# Patient Record
Sex: Male | Born: 1945 | Race: White | Hispanic: No | Marital: Married | Smoking: Never smoker
Health system: Southern US, Community
[De-identification: ages and names within clinical notes are randomized; demographics above are authoritative.]

## PROBLEM LIST (undated history)

## (undated) HISTORY — PX: KNEE SURGERY: SHX244

---

## 2013-04-05 ENCOUNTER — Encounter (HOSPITAL_COMMUNITY): Payer: Self-pay | Admitting: Emergency Medicine

## 2013-04-05 ENCOUNTER — Emergency Department (HOSPITAL_COMMUNITY): Payer: Medicare Other

## 2013-04-05 ENCOUNTER — Emergency Department (HOSPITAL_COMMUNITY)
Admission: EM | Admit: 2013-04-05 | Discharge: 2013-04-05 | Disposition: A | Discharge status: Other Acute Inpatient Hospital | Payer: Medicare Other | Attending: Emergency Medicine | Admitting: Emergency Medicine

## 2013-04-05 DIAGNOSIS — Z23 Encounter for immunization: Secondary | ICD-10-CM | POA: Insufficient documentation

## 2013-04-05 DIAGNOSIS — Y929 Unspecified place or not applicable: Secondary | ICD-10-CM | POA: Insufficient documentation

## 2013-04-05 DIAGNOSIS — IMO0002 Reserved for concepts with insufficient information to code with codable children: Secondary | ICD-10-CM | POA: Insufficient documentation

## 2013-04-05 DIAGNOSIS — W278XXA Contact with other nonpowered hand tool, initial encounter: Secondary | ICD-10-CM | POA: Insufficient documentation

## 2013-04-05 DIAGNOSIS — Y9389 Activity, other specified: Secondary | ICD-10-CM | POA: Insufficient documentation

## 2013-04-05 LAB — BASIC METABOLIC PANEL
BUN: 14 mg/dL (ref 6–23)
CO2: 23 meq/L (ref 19–32)
Calcium: 9.5 mg/dL (ref 8.4–10.5)
Chloride: 99 mEq/L (ref 96–112)
Creatinine, Ser: 0.78 mg/dL (ref 0.50–1.35)
GFR calc Af Amer: >90 mL/min (ref >90)
Glucose, Bld: 127 mg/dL — ABNORMAL HIGH (ref 70–99)
Potassium: 3.5 mEq/L — ABNORMAL LOW (ref 3.7–5.3)
SODIUM: 139 meq/L (ref 137–147)

## 2013-04-05 LAB — CBC WITH DIFFERENTIAL/PLATELET
BASOS ABS: 0.1 10*3/uL (ref 0.0–0.1)
Basophils Relative: 1 % (ref 0–1)
Eosinophils Absolute: 0.1 10*3/uL (ref 0.0–0.7)
Eosinophils Relative: 1 % (ref 0–5)
HCT: 43.2 % (ref 39.0–52.0)
Hemoglobin: 15.1 g/dL (ref 13.0–17.0)
LYMPHS PCT: 53 % — AB (ref 12–46)
Lymphs Abs: 4.9 10*3/uL — ABNORMAL HIGH (ref 0.7–4.0)
MCH: 31 pg (ref 26.0–34.0)
MCHC: 35 g/dL (ref 30.0–36.0)
MCV: 88.7 fL (ref 78.0–100.0)
Monocytes Absolute: 1.1 10*3/uL — ABNORMAL HIGH (ref 0.1–1.0)
Monocytes Relative: 12 % (ref 3–12)
NEUTROS PCT: 33 % — AB (ref 43–77)
Neutro Abs: 3 10*3/uL (ref 1.7–7.7)
PLATELETS: 266 10*3/uL (ref 150–400)
RBC: 4.87 MIL/uL (ref 4.22–5.81)
RDW: 12.5 % (ref 11.5–15.5)
WBC: 9.2 10*3/uL (ref 4.0–10.5)

## 2013-04-05 MED ORDER — CEFAZOLIN SODIUM-DEXTROSE 2-3 GM-% IV SOLR
2.0000 g | Freq: Once | INTRAVENOUS | Status: AC
  Administered 2013-04-05: 2 g via INTRAVENOUS
  Filled 2013-04-05: qty 50

## 2013-04-05 MED ORDER — TETANUS-DIPHTH-ACELL PERTUSSIS 5-2.5-18.5 LF-MCG/0.5 IM SUSP
0.5000 mL | Freq: Once | INTRAMUSCULAR | Status: AC
  Administered 2013-04-05: 0.5 mL via INTRAMUSCULAR
  Filled 2013-04-05: qty 0.5

## 2013-04-05 NOTE — ED Notes (Signed)
Pt reports while working on a table saw. Pt sustained amputation of left middle finger, pointer finger is still intact. Pt has no feeling to last 3 fingers. Pt unsure of what really happened with use of table saw.

## 2013-04-05 NOTE — Consult Note (Signed)
Suszanne Finch Jump Brooke Bonito. is an 68 y.o. male.   Chief Complaint: left hand table saw injury HPI: 68 yo rhd male states he was using a table saw ~ 8 AM to build a table and suffered laceration to the left hand.  Unsure exactly how the injury occurred.  Brought to [facility] where he was evaluated and I was consulted for management of the injury.  He reports no previous injury to left hand and no other injury at this time.  History reviewed. No pertinent past medical history.  Past Surgical History  Procedure Laterality Date  . Knee surgery Left     No family history on file. Social History:  reports that he has never smoked. He does not have any smokeless tobacco history on file. He reports that he does not drink alcohol or use illicit drugs.  Allergies: No Known Allergies   (Not in a hospital admission)  Results for orders placed during the hospital encounter of 04/05/13 (from the past 48 hour(s))  CBC WITH DIFFERENTIAL     Status: Abnormal   Collection Time    04/05/13  8:49 AM      Result Value Ref Range   WBC 9.2  4.0 - 10.5 K/uL   RBC 4.87  4.22 - 5.81 MIL/uL   Hemoglobin 15.1  13.0 - 17.0 g/dL   HCT 43.2  39.0 - 52.0 %   MCV 88.7  78.0 - 100.0 fL   MCH 31.0  26.0 - 34.0 pg   MCHC 35.0  30.0 - 36.0 g/dL   RDW 12.5  11.5 - 15.5 %   Platelets 266  150 - 400 K/uL   Neutrophils Relative % 33 (*) 43 - 77 %   Neutro Abs 3.0  1.7 - 7.7 K/uL   Lymphocytes Relative 53 (*) 12 - 46 %   Lymphs Abs 4.9 (*) 0.7 - 4.0 K/uL   Monocytes Relative 12  3 - 12 %   Monocytes Absolute 1.1 (*) 0.1 - 1.0 K/uL   Eosinophils Relative 1  0 - 5 %   Eosinophils Absolute 0.1  0.0 - 0.7 K/uL   Basophils Relative 1  0 - 1 %   Basophils Absolute 0.1  0.0 - 0.1 K/uL  BASIC METABOLIC PANEL     Status: Abnormal   Collection Time    04/05/13  8:49 AM      Result Value Ref Range   Sodium 139  137 - 147 mEq/L   Potassium 3.5 (*) 3.7 - 5.3 mEq/L   Chloride 99  96 - 112 mEq/L   CO2 23  19 - 32 mEq/L   Glucose, Bld 127 (*) 70 - 99 mg/dL   BUN 14  6 - 23 mg/dL   Creatinine, Ser 0.78  0.50 - 1.35 mg/dL   Calcium 9.5  8.4 - 10.5 mg/dL   GFR calc non Af Amer >90  >90 mL/min   GFR calc Af Amer >90  >90 mL/min   Comment: (NOTE)     The eGFR has been calculated using the CKD EPI equation.     This calculation has not been validated in all clinical situations.     eGFR's persistently <90 mL/min signify possible Chronic Kidney     Disease.    Dg Hand Complete Left  04/05/2013   CLINICAL DATA:  Table saw injury.  EXAM: LEFT HAND - COMPLETE 3+ VIEW  COMPARISON:  None.  FINDINGS: There has been amputation of the left index finger at  the level of the distal portion of the first phalanx. Partial amputations of the third, fourth and fifth fingers are noted with comminuted fractures involving the third and fourth proximal phalanges. Severely displaced fracture is seen involving proximal portion of the fifth proximal phalanx.  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   Electronically Signed   By: Sabino Dick M.D.   On: 04/05/2013 09:13     A comprehensive review of systems was negative.  Blood pressure 165/90, pulse 69, temperature 98 F (36.7 C), temperature source Oral, resp. rate 15, height 5' 10.5" (1.791 m), weight 77.111 kg (170 lb), SpO2 98.00%.  General appearance: alert, cooperative and appears stated age Head: Normocephalic, without obvious abnormality, atraumatic Neck: supple, symmetrical, trachea midline Extremities: right ue: intact sensation and capillary refill all digits.  +epl/fpl/io.  no wounds or ttp.  left ue: thumb: intact sensation and capillary refill, no wounds; index: some sensation ulnar side, intact capillary refill, radial tissues lacerated with bleeding vessel noted, ulnar side intact; long: no sensation or capillary refill, only dorsal skin and extensor tendon intact; ring: decreased sensation,  intact capillary refill in pad, active extension, no active flexion, volar wound; small: no sensation or capillary refill, only dorsal skin remains. Pulses: 2+ and symmetric Skin: as above Neurologic: Grossly normal except as noted above Incision/Wound: As above  Assessment/Plan Table saw injury to all four fingers with neurovascular injury.  Long and small severely compromised.  Discussed nature of injury with patient.  Recommend transfer to [facility] for evaluation for potential repair of digits vs revision of amputation given that all four fingers involved.  Transfer arranged with Dr. Morley Kos through ED at [facility].  Patient agrees with plan of care.  Pressure dressing applied to index finger to control bleeding.  Wounds dressed and volar splint applied.  Keep hand elevated.  Neng Albee R 04/05/2013, 11:31 AM

## 2013-04-05 NOTE — ED Notes (Signed)
Pt dressed in gown 

## 2013-04-05 NOTE — ED Notes (Signed)
Patient transferred to [facility].

## 2013-04-05 NOTE — Progress Notes (Signed)
Orthopedic Tech Progress Note Patient Details:  Roberto PufferLeo Grant Scaglione Jr. [birth year] [id number]  Ortho Devices Type of Ortho Device: Volar splint Ortho Device/Splint Interventions: Application   Cammer, Mickie BailJennifer Carol 04/05/2013, 11:30 AM

## 2013-04-05 NOTE — ED Provider Notes (Signed)
CSN: [id number]     Arrival date & time 04/05/13  [id number] History   First MD Initiated Contact with Patient 04/05/13 [id number]     Chief Complaint  Patient presents with  . Finger Injury     (Consider location/radiation/quality/duration/timing/severity/associated sxs/prior Treatment) Patient is a 68 y.o. male presenting with hand injury.  Hand Injury Location:  Hand Time since incident:  30 minutes Injury: yes   Mechanism of injury: amputation   Amputation:    Extent:  Partial   Cause: Table saw. Hand location:  L hand Pain details:    Quality:  Sharp   Radiates to:  Does not radiate   Pain severity now: 2/10.   Onset quality:  Sudden   Timing:  Constant   Progression:  Unchanged Chronicity:  New Tetanus status:  Out of date Relieved by:  Nothing Worsened by:  Movement Associated symptoms: numbness   Associated symptoms: no neck pain     History reviewed. No pertinent past medical history. Past Surgical History  Procedure Laterality Date  . Knee surgery Left    No family history on file. History  Substance Use Topics  . Smoking status: Never Smoker   . Smokeless tobacco: Not on file  . Alcohol Use: No    Review of Systems  Musculoskeletal: Negative for neck pain.  All other systems reviewed and are negative.      Allergies  Review of patient's allergies indicates no known allergies.  Home Medications  No current outpatient prescriptions on file. BP 167/80  Pulse 78  Temp(Src) 98 F (36.7 C) (Oral)  Resp 17  Ht 5' 10.5" (1.791 m)  Wt 170 lb (77.111 kg)  BMI 24.04 kg/m2  SpO2 98% Physical Exam  Nursing note and vitals reviewed. Constitutional: He is oriented to person, place, and time. He appears well-developed and well-nourished. No distress.  HENT:  Head: Normocephalic and atraumatic.  Eyes: Conjunctivae are normal. No scleral icterus.  Neck: Neck supple.  Cardiovascular: Normal rate and intact distal pulses.   Pulmonary/Chest: Effort normal. No  stridor. No respiratory distress.  Abdominal: Normal appearance. He exhibits no distension.  Musculoskeletal:  Extensive lacerations through the dorsum of his 4 fingers of his left hand. Thumb is spared. Lacerations are just distal to his MCP joints. He has decreased sensation in fingers 3 through 5. Decreased perfusion in all fingers.  Neurological: He is alert and oriented to person, place, and time.  Skin: Skin is warm and dry. No rash noted.  Psychiatric: He has a normal mood and affect. His behavior is normal.    ED Course  Procedures (including critical care time) Labs Review Labs Reviewed  CBC WITH DIFFERENTIAL - Abnormal; Notable for the following:    Neutrophils Relative % 33 (*)    Lymphocytes Relative 53 (*)    Lymphs Abs 4.9 (*)    Monocytes Absolute 1.1 (*)    All other components within normal limits  BASIC METABOLIC PANEL - Abnormal; Notable for the following:    Potassium 3.5 (*)    Glucose, Bld 127 (*)    All other components within normal limits   Imaging Review Dg Hand Complete Left  04/05/2013   CLINICAL DATA:  Table saw injury.  EXAM: LEFT HAND - COMPLETE 3+ VIEW  COMPARISON:  None.  FINDINGS: There has been amputation of the left index finger at the level of the distal portion of the first phalanx. Partial amputations of the third, fourth and fifth fingers are noted with comminuted  fractures involving the third and fourth proximal phalanges. Severely displaced fracture is seen involving proximal portion of the fifth proximal phalanx.  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   Electronically Signed   By: Roque Lias M.D.   On: 04/05/2013 09:13  All radiology studies independently viewed by me.      EKG Interpretation   Date/Time:  Saturday April 05 2013 09:03:03 EDT Ventricular Rate:  80 PR Interval:  183 QRS Duration: 83 QT Interval:  389 QTC Calculation: 449 R Axis:    70 Text Interpretation:  Sinus rhythm LVH with secondary repolarization  abnormality Anterior Q waves, possibly due to LVH No old tracing to  compare Confirmed by [facility]  MD, TREY ([id number]) on 04/05/2013 10:16:47 AM      MDM   Final diagnoses:  Traumatic amputation of other finger(s) (complete) (partial), without mention of complication    68 year old male presenting with partial amputation of left second through fifth fingers. I consulted Dr. Merlyn Lot (hand surgery) within 20 minutes of patient's initial arrival. Also given tetanus update and IV Ancef.  11:12 AM Pt seen and evaluated by Dr. Merlyn Lot.  He has discussed with Dr. Thompson Caul (Plastic Surgery at [facility]) who will accept in transfer for repair of his hand.  At this time, pt has declined pain medication.    Meds ordered this encounter  Medications  . Tdap (BOOSTRIX) injection 0.5 mL    Sig:   . ceFAZolin (ANCEF) IVPB 2 g/50 mL premix    Sig:     Order Specific Question:  Antibiotic Indication:    Answer:  Other Indication (list below)     Candyce Churn III, MD 04/05/13 [phone]

## 2015-08-11 IMAGING — CR DG HAND COMPLETE 3+V*L*
3 series · 3 of 3 positions shown · non-contrast
Comparison: None.

CLINICAL DATA: Table saw injury.

EXAM:
LEFT HAND - COMPLETE 3+ VIEW

[PA]
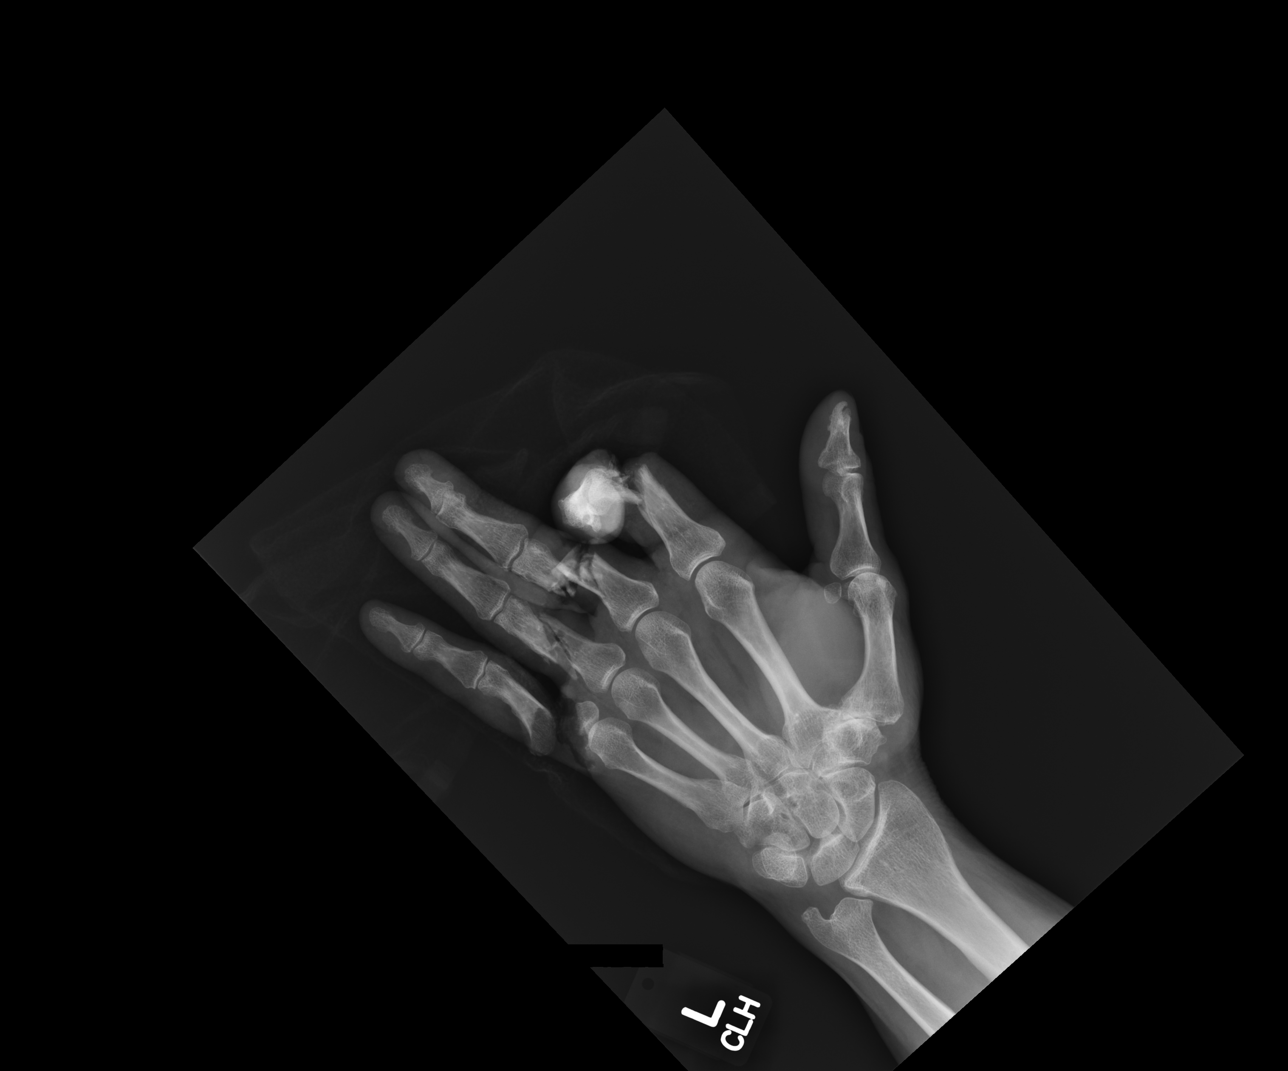

[lateral]
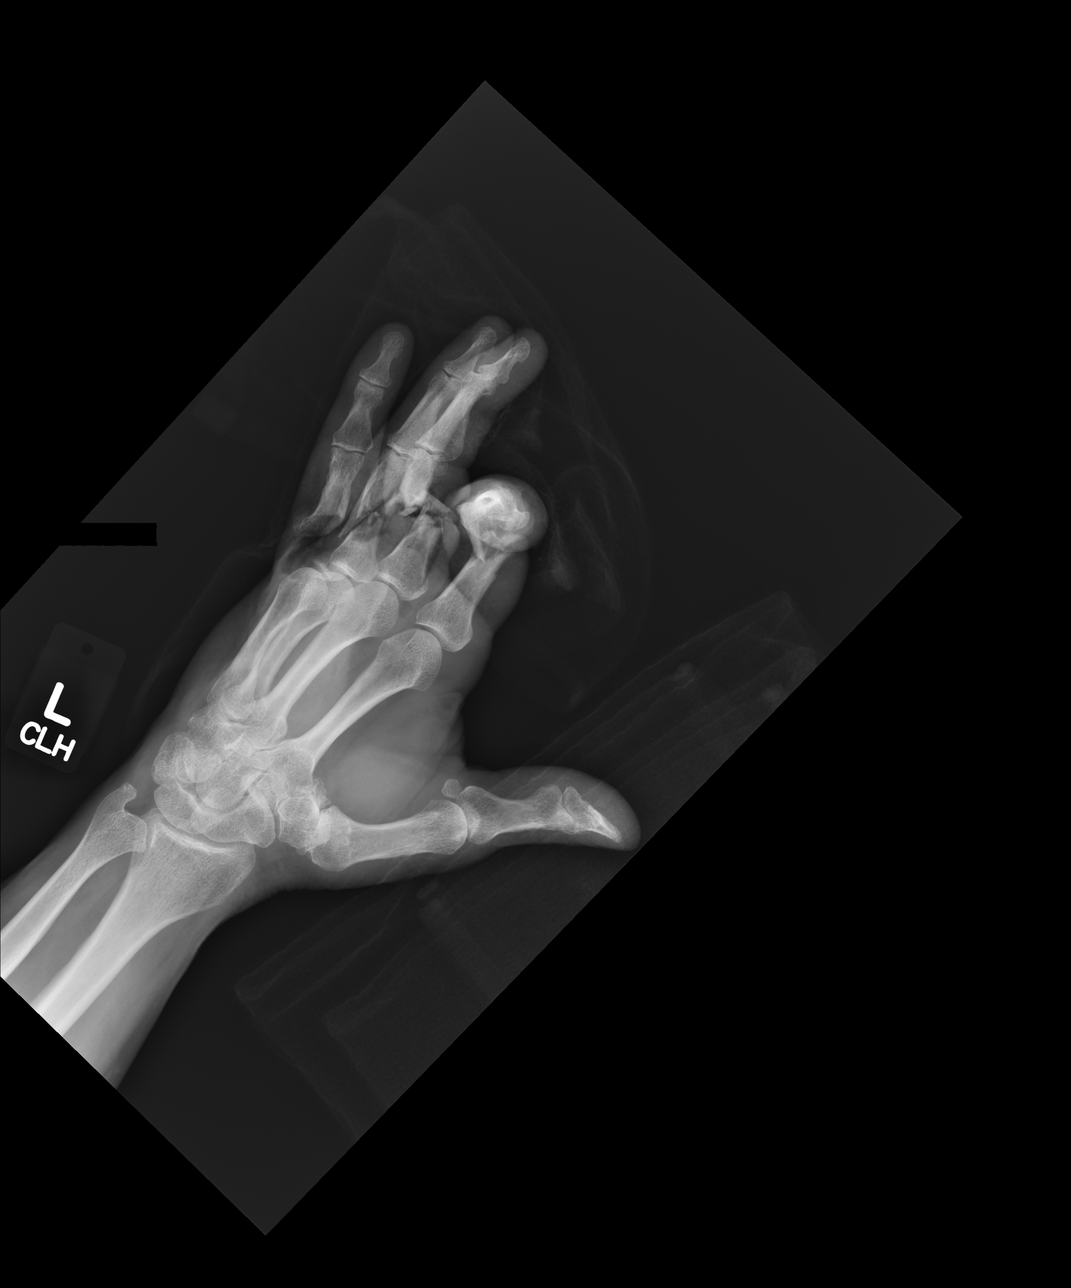

[pa obl]
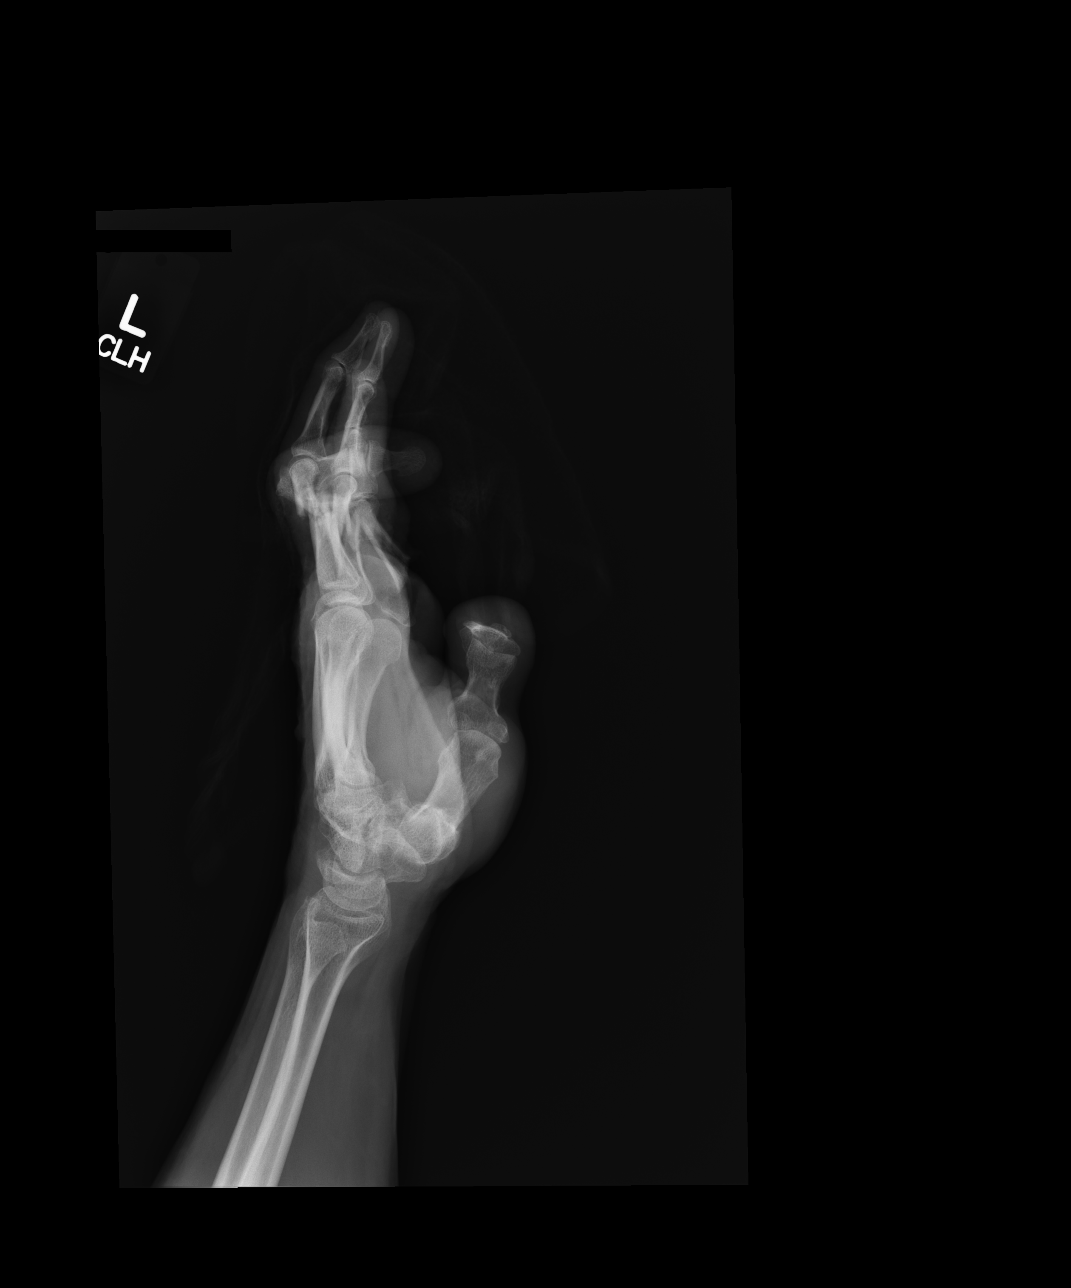

[3 of 3 positions shown; findings below may reference images not displayed]

FINDINGS: There has been amputation of the left index finger at the level of
the distal portion of the first phalanx. Partial amputations of the
third, fourth and fifth fingers are noted with comminuted fractures
involving the third and fourth proximal phalanges. Severely
displaced fracture is seen involving proximal portion of the fifth
proximal phalanx.
IMPRESSION: Traumatic amputation of the left index finger with partial
amputations of the third, fourth and fifth fingers.

## 2022-11-18 ENCOUNTER — Emergency Department (HOSPITAL_COMMUNITY): Payer: Medicare Other

## 2022-11-18 ENCOUNTER — Other Ambulatory Visit: Payer: Self-pay

## 2022-11-18 ENCOUNTER — Encounter (HOSPITAL_COMMUNITY): Payer: Self-pay

## 2022-11-18 ENCOUNTER — Inpatient Hospital Stay (HOSPITAL_COMMUNITY): Payer: Medicare Other

## 2022-11-18 ENCOUNTER — Encounter (HOSPITAL_COMMUNITY)
Admission: EM | Disposition: A | Discharge status: Home / Self Care | Payer: Self-pay | Source: Home / Self Care | Attending: Neurological Surgery

## 2022-11-18 ENCOUNTER — Inpatient Hospital Stay (HOSPITAL_COMMUNITY)
Admission: EM | Admit: 2022-11-18 | Discharge: 2022-11-20 | DRG: 030 | Disposition: A | Discharge status: Home / Self Care | Payer: Medicare Other | Attending: Neurological Surgery | Admitting: Neurological Surgery

## 2022-11-18 DIAGNOSIS — S90512A Abrasion, left ankle, initial encounter: Secondary | ICD-10-CM | POA: Diagnosis present

## 2022-11-18 DIAGNOSIS — S13161A Dislocation of C5/C6 cervical vertebrae, initial encounter: Secondary | ICD-10-CM | POA: Diagnosis present

## 2022-11-18 DIAGNOSIS — S40212A Abrasion of left shoulder, initial encounter: Secondary | ICD-10-CM | POA: Diagnosis present

## 2022-11-18 DIAGNOSIS — W12XXXA Fall on and from scaffolding, initial encounter: Secondary | ICD-10-CM | POA: Diagnosis present

## 2022-11-18 DIAGNOSIS — S12530A Unspecified traumatic displaced spondylolisthesis of sixth cervical vertebra, initial encounter for closed fracture: Secondary | ICD-10-CM | POA: Diagnosis present

## 2022-11-18 DIAGNOSIS — Y9389 Activity, other specified: Secondary | ICD-10-CM

## 2022-11-18 DIAGNOSIS — M532X2 Spinal instabilities, cervical region: Secondary | ICD-10-CM | POA: Diagnosis present

## 2022-11-18 DIAGNOSIS — D72829 Elevated white blood cell count, unspecified: Secondary | ICD-10-CM | POA: Diagnosis present

## 2022-11-18 DIAGNOSIS — S12500A Unspecified displaced fracture of sixth cervical vertebra, initial encounter for closed fracture: Secondary | ICD-10-CM | POA: Diagnosis present

## 2022-11-18 DIAGNOSIS — S12400A Unspecified displaced fracture of fifth cervical vertebra, initial encounter for closed fracture: Secondary | ICD-10-CM

## 2022-11-18 DIAGNOSIS — R531 Weakness: Secondary | ICD-10-CM | POA: Diagnosis present

## 2022-11-18 DIAGNOSIS — S14155A Other incomplete lesion at C5 level of cervical spinal cord, initial encounter: Principal | ICD-10-CM | POA: Diagnosis present

## 2022-11-18 DIAGNOSIS — Y92018 Other place in single-family (private) house as the place of occurrence of the external cause: Secondary | ICD-10-CM | POA: Diagnosis not present

## 2022-11-18 DIAGNOSIS — Z9889 Other specified postprocedural states: Secondary | ICD-10-CM

## 2022-11-18 DIAGNOSIS — M542 Cervicalgia: Secondary | ICD-10-CM | POA: Diagnosis present

## 2022-11-18 DIAGNOSIS — S129XXA Fracture of neck, unspecified, initial encounter: Secondary | ICD-10-CM | POA: Diagnosis present

## 2022-11-18 DIAGNOSIS — W19XXXA Unspecified fall, initial encounter: Principal | ICD-10-CM

## 2022-11-18 DIAGNOSIS — M4312 Spondylolisthesis, cervical region: Secondary | ICD-10-CM

## 2022-11-18 HISTORY — PX: ANTERIOR CERVICAL DECOMP/DISCECTOMY FUSION: SHX1161

## 2022-11-18 LAB — SAMPLE TO BLOOD BANK

## 2022-11-18 LAB — COMPREHENSIVE METABOLIC PANEL
ALT: 24 U/L (ref 0–44)
AST: 60 U/L — ABNORMAL HIGH (ref 15–41)
Albumin: 3.6 g/dL (ref 3.5–5.0)
Alkaline Phosphatase: 67 U/L (ref 38–126)
Anion gap: 8 (ref 5–15)
BUN: 20 mg/dL (ref 8–23)
CO2: 23 mmol/L (ref 22–32)
Calcium: 8.6 mg/dL — ABNORMAL LOW (ref 8.9–10.3)
Chloride: 105 mmol/L (ref 98–111)
Creatinine, Ser: 0.92 mg/dL (ref 0.61–1.24)
GFR, Estimated: >60 mL/min (ref >60)
Glucose, Bld: 125 mg/dL — ABNORMAL HIGH (ref 70–99)
Potassium: 4.1 mmol/L (ref 3.5–5.1)
Sodium: 136 mmol/L (ref 135–145)
Total Bilirubin: 1.1 mg/dL (ref 0.3–1.2)
Total Protein: 6 g/dL — ABNORMAL LOW (ref 6.5–8.1)

## 2022-11-18 LAB — CBC
HCT: 38.1 % — ABNORMAL LOW (ref 39.0–52.0)
Hemoglobin: 12.4 g/dL — ABNORMAL LOW (ref 13.0–17.0)
MCH: 28.8 pg (ref 26.0–34.0)
MCHC: 32.5 g/dL (ref 30.0–36.0)
MCV: 88.4 fL (ref 80.0–100.0)
Platelets: 206 10*3/uL (ref 150–400)
RBC: 4.31 MIL/uL (ref 4.22–5.81)
RDW: 12.8 % (ref 11.5–15.5)
WBC: 11.9 10*3/uL — ABNORMAL HIGH (ref 4.0–10.5)
nRBC: 0 % (ref 0.0–0.2)

## 2022-11-18 LAB — PROTIME-INR
INR: 1 (ref 0.8–1.2)
Prothrombin Time: 13.4 s (ref 11.4–15.2)

## 2022-11-18 LAB — ETHANOL: Alcohol, Ethyl (B): <10 mg/dL (ref <10)

## 2022-11-18 SURGERY — ANTERIOR CERVICAL DECOMPRESSION/DISCECTOMY FUSION 1 LEVEL
Anesthesia: General | Site: Neck

## 2022-11-18 MED ORDER — DEXAMETHASONE SODIUM PHOSPHATE 10 MG/ML IJ SOLN
INTRAMUSCULAR | Status: AC
  Filled 2022-11-18: qty 1

## 2022-11-18 MED ORDER — OXYCODONE HCL 5 MG PO TABS: 10.0000 mg | ORAL_TABLET | ORAL | Status: DC | PRN

## 2022-11-18 MED ORDER — CYCLOBENZAPRINE HCL 10 MG PO TABS: 10.0000 mg | ORAL_TABLET | Freq: Three times a day (TID) | ORAL | Status: DC | PRN

## 2022-11-18 MED ORDER — ONDANSETRON HCL 4 MG PO TABS
4.0000 mg | ORAL_TABLET | Freq: Four times a day (QID) | ORAL | Status: DC | PRN
Start: 2022-11-18 — End: 2022-11-20

## 2022-11-18 MED ORDER — BACITRACIN ZINC 500 UNIT/GM EX OINT
TOPICAL_OINTMENT | CUTANEOUS | Status: AC
Start: 2022-11-18 — End: ?
  Filled 2022-11-18: qty 28.35

## 2022-11-18 MED ORDER — SODIUM CHLORIDE 0.9% FLUSH
3.0000 mL | Freq: Two times a day (BID) | INTRAVENOUS | Status: DC
  Administered 2022-11-19 (×2): 3 mL via INTRAVENOUS

## 2022-11-18 MED ORDER — CEFAZOLIN SODIUM-DEXTROSE 2-3 GM-%(50ML) IV SOLR
INTRAVENOUS | Status: DC | PRN
  Administered 2022-11-18: 2 g via INTRAVENOUS

## 2022-11-18 MED ORDER — CEFAZOLIN SODIUM-DEXTROSE 2-4 GM/100ML-% IV SOLN
2.0000 g | Freq: Three times a day (TID) | INTRAVENOUS | Status: AC
  Administered 2022-11-19 (×2): 2 g via INTRAVENOUS
  Filled 2022-11-18 (×2): qty 100

## 2022-11-18 MED ORDER — LIDOCAINE 2% (20 MG/ML) 5 ML SYRINGE
INTRAMUSCULAR | Status: AC
  Filled 2022-11-18: qty 5

## 2022-11-18 MED ORDER — DEXAMETHASONE SODIUM PHOSPHATE 10 MG/ML IJ SOLN
INTRAMUSCULAR | Status: DC | PRN
  Administered 2022-11-18: 10 mg via INTRAVENOUS

## 2022-11-18 MED ORDER — ROCURONIUM BROMIDE 10 MG/ML (PF) SYRINGE
PREFILLED_SYRINGE | INTRAVENOUS | Status: DC | PRN
  Administered 2022-11-18: 70 mg via INTRAVENOUS

## 2022-11-18 MED ORDER — TETANUS-DIPHTH-ACELL PERTUSSIS 5-2.5-18.5 LF-MCG/0.5 IM SUSY
0.5000 mL | PREFILLED_SYRINGE | Freq: Once | INTRAMUSCULAR | Status: DC
  Filled 2022-11-18: qty 0.5

## 2022-11-18 MED ORDER — DROPERIDOL 2.5 MG/ML IJ SOLN: 0.6250 mg | Freq: Once | INTRAMUSCULAR | Status: DC | PRN

## 2022-11-18 MED ORDER — SODIUM CHLORIDE 0.9 % IV SOLN
250.0000 mL | INTRAVENOUS | Status: AC
  Administered 2022-11-18: 250 mL via INTRAVENOUS

## 2022-11-18 MED ORDER — THROMBIN 5000 UNITS EX SOLR
CUTANEOUS | Status: AC
Start: 2022-11-18 — End: ?
  Filled 2022-11-18: qty 5000

## 2022-11-18 MED ORDER — ACETAMINOPHEN 325 MG PO TABS
650.0000 mg | ORAL_TABLET | ORAL | Status: DC | PRN
  Filled 2022-11-18: qty 2

## 2022-11-18 MED ORDER — EPHEDRINE SULFATE-NACL 50-0.9 MG/10ML-% IV SOSY
PREFILLED_SYRINGE | INTRAVENOUS | Status: DC | PRN
  Administered 2022-11-18: 5 mg via INTRAVENOUS

## 2022-11-18 MED ORDER — POLYETHYLENE GLYCOL 3350 17 G PO PACK: 17.0000 g | PACK | Freq: Every day | ORAL | Status: DC | PRN

## 2022-11-18 MED ORDER — HYDROMORPHONE HCL 1 MG/ML IJ SOLN: 1.0000 mg | INTRAMUSCULAR | Status: DC | PRN

## 2022-11-18 MED ORDER — ROCURONIUM BROMIDE 10 MG/ML (PF) SYRINGE
PREFILLED_SYRINGE | INTRAVENOUS | Status: AC
  Filled 2022-11-18: qty 10

## 2022-11-18 MED ORDER — THROMBIN 5000 UNITS EX SOLR: OROMUCOSAL | Status: DC | PRN

## 2022-11-18 MED ORDER — FENTANYL CITRATE (PF) 250 MCG/5ML IJ SOLN
INTRAMUSCULAR | Status: DC | PRN
  Administered 2022-11-18: 150 ug via INTRAVENOUS

## 2022-11-18 MED ORDER — LORAZEPAM 1 MG PO TABS: 1.0000 mg | ORAL_TABLET | Freq: Once | ORAL | Status: DC

## 2022-11-18 MED ORDER — 0.9 % SODIUM CHLORIDE (POUR BTL) OPTIME
TOPICAL | Status: DC | PRN
  Administered 2022-11-18: 1000 mL

## 2022-11-18 MED ORDER — OXYCODONE HCL 5 MG/5ML PO SOLN: 5.0000 mg | Freq: Once | ORAL | Status: DC | PRN

## 2022-11-18 MED ORDER — PROPOFOL 10 MG/ML IV BOLUS
INTRAVENOUS | Status: DC | PRN
  Administered 2022-11-18: 150 mg via INTRAVENOUS

## 2022-11-18 MED ORDER — FENTANYL CITRATE (PF) 250 MCG/5ML IJ SOLN
INTRAMUSCULAR | Status: AC
  Filled 2022-11-18: qty 5

## 2022-11-18 MED ORDER — SODIUM CHLORIDE 0.9% FLUSH
3.0000 mL | INTRAVENOUS | Status: DC | PRN
Start: 2022-11-18 — End: 2022-11-20

## 2022-11-18 MED ORDER — OXYCODONE HCL 5 MG PO TABS: 5.0000 mg | ORAL_TABLET | Freq: Once | ORAL | Status: DC | PRN

## 2022-11-18 MED ORDER — CEFAZOLIN SODIUM 1 G IJ SOLR
INTRAMUSCULAR | Status: AC
Start: 2022-11-18 — End: ?
  Filled 2022-11-18: qty 20

## 2022-11-18 MED ORDER — ONDANSETRON HCL 4 MG/2ML IJ SOLN
INTRAMUSCULAR | Status: DC | PRN
  Administered 2022-11-18: 4 mg via INTRAVENOUS

## 2022-11-18 MED ORDER — FENTANYL CITRATE (PF) 100 MCG/2ML IJ SOLN: 25.0000 ug | INTRAMUSCULAR | Status: DC | PRN

## 2022-11-18 MED ORDER — EPHEDRINE 5 MG/ML INJ
INTRAVENOUS | Status: AC
  Filled 2022-11-18: qty 5

## 2022-11-18 MED ORDER — LACTATED RINGERS IV SOLN: INTRAVENOUS | Status: DC | PRN

## 2022-11-18 MED ORDER — ACETAMINOPHEN 650 MG RE SUPP: 650.0000 mg | RECTAL | Status: DC | PRN

## 2022-11-18 MED ORDER — ONDANSETRON HCL 4 MG/2ML IJ SOLN
4.0000 mg | Freq: Four times a day (QID) | INTRAMUSCULAR | Status: DC | PRN
Start: 2022-11-18 — End: 2022-11-20

## 2022-11-18 MED ORDER — LIDOCAINE-EPINEPHRINE 1 %-1:100000 IJ SOLN
INTRAMUSCULAR | Status: AC
  Filled 2022-11-18: qty 1

## 2022-11-18 MED ORDER — DOCUSATE SODIUM 100 MG PO CAPS
100.0000 mg | ORAL_CAPSULE | Freq: Two times a day (BID) | ORAL | Status: DC
  Administered 2022-11-19 – 2022-11-20 (×4): 100 mg via ORAL
  Filled 2022-11-18 (×4): qty 1

## 2022-11-18 MED ORDER — ONDANSETRON HCL 4 MG/2ML IJ SOLN
INTRAMUSCULAR | Status: AC
Start: 2022-11-18 — End: ?
  Filled 2022-11-18: qty 2

## 2022-11-18 MED ORDER — PROPOFOL 10 MG/ML IV BOLUS
INTRAVENOUS | Status: AC
  Filled 2022-11-18: qty 20

## 2022-11-18 MED ORDER — LIDOCAINE 2% (20 MG/ML) 5 ML SYRINGE
INTRAMUSCULAR | Status: DC | PRN
  Administered 2022-11-18: 80 mg via INTRAVENOUS

## 2022-11-18 MED ORDER — LIDOCAINE-EPINEPHRINE 1 %-1:100000 IJ SOLN
INTRAMUSCULAR | Status: DC | PRN
  Administered 2022-11-18: 10 mL via INTRADERMAL

## 2022-11-18 MED ORDER — MENTHOL 3 MG MT LOZG: 1.0000 | LOZENGE | OROMUCOSAL | Status: DC | PRN

## 2022-11-18 MED ORDER — PHENOL 1.4 % MT LIQD: 1.0000 | OROMUCOSAL | Status: DC | PRN

## 2022-11-18 MED ORDER — OXYCODONE HCL 5 MG PO TABS: 5.0000 mg | ORAL_TABLET | ORAL | Status: DC | PRN

## 2022-11-18 MED ORDER — ACETAMINOPHEN 10 MG/ML IV SOLN: 1000.0000 mg | Freq: Once | INTRAVENOUS | Status: DC | PRN

## 2022-11-18 MED ORDER — SUGAMMADEX SODIUM 200 MG/2ML IV SOLN
INTRAVENOUS | Status: DC | PRN
  Administered 2022-11-18 (×2): 100 mg via INTRAVENOUS

## 2022-11-18 SURGICAL SUPPLY — 68 items
ADH SKN CLS APL DERMABOND .7 (GAUZE/BANDAGES/DRESSINGS) ×4
ADH SKN CLS LQ APL DERMABOND (GAUZE/BANDAGES/DRESSINGS) ×2
APL SKNCLS STERI-STRIP NONHPOA (GAUZE/BANDAGES/DRESSINGS)
BAG COUNTER SPONGE SURGICOUNT (BAG) ×4 IMPLANT
BAG SPNG CNTER NS LX DISP (BAG) ×4
BENZOIN TINCTURE PRP APPL 2/3 (GAUZE/BANDAGES/DRESSINGS) IMPLANT
BLADE CLIPPER SURG (BLADE) ×2 IMPLANT
BLADE SURG 11 STRL SS (BLADE) ×2 IMPLANT
BUR MATCHSTICK NEURO 3.0 LAGG (BURR) ×2 IMPLANT
BUR PRECISION FLUTE 5.0 (BURR) ×2 IMPLANT
CANISTER SUCT 3000ML PPV (MISCELLANEOUS) ×4 IMPLANT
DERMABOND ADVANCED .7 DNX12 (GAUZE/BANDAGES/DRESSINGS) ×4 IMPLANT
DERMABOND ADVANCED .7 DNX6 (GAUZE/BANDAGES/DRESSINGS) ×1 IMPLANT
DRAPE C-ARM 42X72 X-RAY (DRAPES) ×8 IMPLANT
DRAPE HALF SHEET 40X57 (DRAPES) IMPLANT
DRAPE LAPAROTOMY 100X72 PEDS (DRAPES) ×4 IMPLANT
DRAPE MICROSCOPE SLANT 54X150 (MISCELLANEOUS) ×2 IMPLANT
DURAPREP 6ML APPLICATOR 50/CS (WOUND CARE) ×4 IMPLANT
ELECT COATED BLADE 2.86 ST (ELECTRODE) ×2 IMPLANT
ELECT REM PT RETURN 9FT ADLT (ELECTROSURGICAL) ×4
ELECTRODE REM PT RTRN 9FT ADLT (ELECTROSURGICAL) ×4 IMPLANT
GAUZE 4X4 16PLY ~~LOC~~+RFID DBL (SPONGE) IMPLANT
GAUZE SPONGE 4X4 12PLY STRL (GAUZE/BANDAGES/DRESSINGS) IMPLANT
GLOVE BIO SURGEON STRL SZ7 (GLOVE) ×1 IMPLANT
GLOVE BIO SURGEON STRL SZ7.5 (GLOVE) ×4 IMPLANT
GLOVE BIOGEL PI IND STRL 7.5 (GLOVE) ×9 IMPLANT
GLOVE ECLIPSE 7.5 STRL STRAW (GLOVE) ×4 IMPLANT
GLOVE EXAM NITRILE LRG STRL (GLOVE) IMPLANT
GLOVE EXAM NITRILE XL STR (GLOVE) IMPLANT
GLOVE EXAM NITRILE XS STR PU (GLOVE) IMPLANT
GOWN STRL REUS W/ TWL LRG LVL3 (GOWN DISPOSABLE) ×4 IMPLANT
GOWN STRL REUS W/ TWL XL LVL3 (GOWN DISPOSABLE) ×2 IMPLANT
GOWN STRL REUS W/TWL 2XL LVL3 (GOWN DISPOSABLE) IMPLANT
GOWN STRL REUS W/TWL LRG LVL3 (GOWN DISPOSABLE) ×4
GOWN STRL REUS W/TWL XL LVL3 (GOWN DISPOSABLE) ×2
HEMOSTAT POWDER KIT SURGIFOAM (HEMOSTASIS) ×4 IMPLANT
KIT BASIN OR (CUSTOM PROCEDURE TRAY) ×4 IMPLANT
KIT TURNOVER KIT B (KITS) ×4 IMPLANT
NDL HYPO 22X1.5 SAFETY MO (MISCELLANEOUS) ×2 IMPLANT
NDL SPNL 18GX3.5 QUINCKE PK (NEEDLE) ×1 IMPLANT
NEEDLE HYPO 22X1.5 SAFETY MO (MISCELLANEOUS) ×4 IMPLANT
NEEDLE SPNL 18GX3.5 QUINCKE PK (NEEDLE) ×2 IMPLANT
NS IRRIG 1000ML POUR BTL (IV SOLUTION) ×4 IMPLANT
PACK LAMINECTOMY NEURO (CUSTOM PROCEDURE TRAY) ×4 IMPLANT
PAD ARMBOARD 7.5X6 YLW CONV (MISCELLANEOUS) ×11 IMPLANT
PIN DISTRACTION 14MM (PIN) ×2 IMPLANT
PIN MAYFIELD SKULL DISP (PIN) ×2 IMPLANT
PLATE ELITE 21MM (Plate) ×1 IMPLANT
SCREW ANT CERV SD ATS 4X16 (Screw) ×4 IMPLANT
SPACER BONE CORNERSTONE 6X14 (Orthopedic Implant) ×1 IMPLANT
SPIKE FLUID TRANSFER (MISCELLANEOUS) ×4 IMPLANT
SPONGE INTESTINAL PEANUT (DISPOSABLE) ×2 IMPLANT
SPONGE SURGIFOAM ABS GEL SZ50 (HEMOSTASIS) IMPLANT
SPONGE T-LAP 4X18 ~~LOC~~+RFID (SPONGE) IMPLANT
STAPLER VISISTAT 35W (STAPLE) ×2 IMPLANT
SUT ETHILON 3 0 FSL (SUTURE) IMPLANT
SUT MNCRL AB 3-0 PS2 18 (SUTURE) ×4 IMPLANT
SUT MON AB 3-0 SH 27 (SUTURE) ×2
SUT MON AB 3-0 SH27 (SUTURE) ×2 IMPLANT
SUT VIC AB 0 CT1 18XCR BRD8 (SUTURE) ×2 IMPLANT
SUT VIC AB 0 CT1 8-18 (SUTURE) ×2
SUT VIC AB 2-0 CP2 18 (SUTURE) ×2 IMPLANT
SUT VIC AB 3-0 SH 8-18 (SUTURE) ×2 IMPLANT
TAPE CLOTH 3X10 TAN LF (GAUZE/BANDAGES/DRESSINGS) ×2 IMPLANT
TOWEL GREEN STERILE (TOWEL DISPOSABLE) ×4 IMPLANT
TOWEL GREEN STERILE FF (TOWEL DISPOSABLE) ×4 IMPLANT
UNDERPAD 30X36 HEAVY ABSORB (UNDERPADS AND DIAPERS) ×2 IMPLANT
WATER STERILE IRR 1000ML POUR (IV SOLUTION) ×4 IMPLANT

## 2022-11-18 NOTE — ED Provider Notes (Signed)
[place] EMERGENCY DEPARTMENT AT [facility] Provider Note   CSN: [id number] Arrival date & time: 11/18/22  1514     History  Chief Complaint  Patient presents with   Fall    Not on thinners    M[employer] Manners Mathan Darroch. is a 77 y.o. male.  Patient is a 77 year old male with no known past medical history presenting to the emergency department after a fall.  Patient was standing on about 7 foot scaffolding working on his roof when he lost his balance and fell.  He states that he hit the back of his head but denies any loss of consciousness.  He states that he is mostly having pain in his neck.  States that his hands feel a little tingly.  He denies any other pain or injuries from the fall.  He denies any lightheadedness or dizziness, chest pain or shortness of breath prior to the fall.  He states he is unsure when his tetanus was last updated.  The history is provided by the patient.  Fall       Home Medications Prior to Admission medications   Not on File      Allergies    Patient has no known allergies.    Review of Systems   Review of Systems  Physical Exam Updated Vital Signs BP (!) 143/65 (BP Location: Right Arm)   Pulse 64   Temp 98 F (36.7 C) (Oral)   Resp 18   Ht 5\' 11"  (1.803 m)   Wt 74.8 kg   SpO2 98%   BMI 23.01 kg/m  Physical Exam Vitals and nursing note reviewed.  Constitutional:      General: He is not in acute distress.    Appearance: Normal appearance.  HENT:     Head: Normocephalic and atraumatic.     Nose: Nose normal.     Mouth/Throat:     Mouth: Mucous membranes are moist.     Pharynx: Oropharynx is clear.  Eyes:     Extraocular Movements: Extraocular movements intact.     Conjunctiva/sclera: Conjunctivae normal.  Neck:     Comments: Mid lower neck tenderness to palpation, c-collar in place Cardiovascular:     Rate and Rhythm: Normal rate and regular rhythm.     Heart sounds: Normal heart sounds.  Pulmonary:      Effort: Pulmonary effort is normal.     Breath sounds: Normal breath sounds.  Abdominal:     General: Abdomen is flat.     Palpations: Abdomen is soft.     Tenderness: There is no abdominal tenderness.  Musculoskeletal:        General: Normal range of motion.     Comments: No midline back tenderness Mild tenderness to palpation of left forearm, no tenderness to right upper extremity or bilateral lower extremities  Skin:    General: Skin is warm and dry.     Comments: Abrasion to left shoulder and left ankle  Neurological:     Mental Status: He is alert and oriented to person, place, and time.     Comments: 4 out of 5 strength in bilateral grip strength and elbow flexion, 5 out of 5 strength in elbow extension and shoulder abduction bilaterally Subjectively decreased sensation in bilateral hands 5 out of 5 strength in bilateral lower extremities  Psychiatric:        Mood and Affect: Mood normal.        Behavior: Behavior normal.     ED  Results / Procedures / Treatments   Labs (all labs ordered are listed, but only abnormal results are displayed) Labs Reviewed  COMPREHENSIVE METABOLIC PANEL - Abnormal; Notable for the following components:      Result Value   Glucose, Bld 125 (*)    Calcium 8.6 (*)    Total Protein 6.0 (*)    AST 60 (*)    All other components within normal limits  CBC - Abnormal; Notable for the following components:   WBC 11.9 (*)    Hemoglobin 12.4 (*)    HCT 38.1 (*)    All other components within normal limits  ETHANOL  PROTIME-INR  URINALYSIS, ROUTINE W REFLEX MICROSCOPIC  SAMPLE TO BLOOD BANK    EKG None  Radiology DG Cervical Spine 2 or 3 views  Result Date: 11/18/2022 CLINICAL DATA:  ACDF EXAM: CERVICAL SPINE - 2-3 VIEW; DG C-ARM 1-60 MIN-NO REPORT COMPARISON:  CT cervical spine dated 11/18/2022 Fluoroscopy time: 7.2 seconds 1.08 mGy FINDINGS: Intraoperative fluoroscopic images during lower cervical ACDF, likely at C5-6. IMPRESSION:  Intraoperative fluoroscopic images during lower cervical ACDF. Electronically Signed   By: Charline Bills M.D.   On: 11/18/2022 21:45   DG C-Arm 1-60 Min-No Report  Result Date: 11/18/2022 Fluoroscopy was utilized by the requesting physician.  No radiographic interpretation.   CT CERVICAL SPINE WO CONTRAST  Result Date: 11/18/2022 CLINICAL DATA:  Polytrauma, blunt. Patient fell eight feet from scaffolding. Loss of consciousness and neck pain. In C-collar. EXAM: CT CERVICAL SPINE WITHOUT CONTRAST TECHNIQUE: Multidetector CT imaging of the cervical spine was performed without intravenous contrast. Multiplanar CT image reconstructions were also generated. RADIATION DOSE REDUCTION: This exam was performed according to the departmental dose-optimization program which includes automated exposure control, adjustment of the mA and/or kV according to patient size and/or use of iterative reconstruction technique. COMPARISON:  None Available. FINDINGS: Alignment: The alignment is grossly abnormal with approximately 6 mm of anterolisthesis of C5-C6. The C5-6 facet joints appear perched and possibly ankylosed based on the sagittal images. There is abnormal widening of the C5-6 interspinous distance. There is narrowing of the C6-7 interspinous distance. Skull base and vertebrae: As above, abnormal anterolisthesis at C5-6 which is likely traumatic in etiology, although age indeterminate. There is fragmentation of the anterior inferior corner of C6 which could reflect a fracture, age indeterminate. As above, the C5-6 facet joints appear perched and possibly ankylosed based on the sagittal images. No definite acute fracture of the posterior elements. Soft tissues and spinal canal: No prevertebral fluid or swelling. There is a small amount of gas anterior to the C6 vertebral body which may relate to a chronic anterior disc extrusion. No visible canal hematoma. Disc levels: There is mild disc space narrowing with asymmetric  facet hypertrophy on the left in the upper cervical spine. Resulting asymmetric left foraminal narrowing at C2-3 and C3-4. At C5-6, there is mild spinal stenosis and moderate foraminal narrowing bilaterally. At C6-7, there is chronic spondylosis with loss of disc height and posterior osteophytes contributing to moderate to severe foraminal narrowing bilaterally. Upper chest: Clear lung apices. Other: Bilateral carotid atherosclerosis. IMPRESSION: 1. Significant abnormal anterolisthesis at C5-6 with perched and possibly ankylosed facet joints and widening of the C5-6 interspinous distance. Fragmentation of the anterior inferior corner of C6 which could reflect a fracture, age indeterminate. These findings are worrisome for age-indeterminate flexion injury with possible ligamentous instability. Cannot exclude instability or cord injury. Recommend further evaluation with cervical MRI. 2. Multilevel spondylosis as described, greatest  at C6-7 with there is moderate to severe foraminal narrowing bilaterally. 3. Critical Value/emergent results were called by telephone at the time of interpretation on 11/18/2022 at 5:34 pm to provider [facility] , who verbally acknowledged these results. Electronically Signed   By: Carey Bullocks M.D.   On: 11/18/2022 17:34   DG Forearm Left  Result Date: 11/18/2022 CLINICAL DATA:  Fall from height, blunt left arm injury. EXAM: LEFT FOREARM - 2 VIEW COMPARISON:  None Available. FINDINGS: There is no evidence of fracture or other focal bone lesions. Soft tissues are unremarkable. IMPRESSION: Negative. Electronically Signed   By: Helyn Numbers M.D.   On: 11/18/2022 17:27   DG Pelvis Portable  Result Date: 11/18/2022 CLINICAL DATA:  Fall from height, lower extremity paralysis EXAM: PORTABLE PELVIS 1-2 VIEWS COMPARISON:  None Available. FINDINGS: There is no evidence of pelvic fracture or diastasis. No pelvic bone lesions are seen. IMPRESSION: Negative. Electronically Signed    By: Helyn Numbers M.D.   On: 11/18/2022 17:26   DG Chest Port 1 View  Result Date: 11/18/2022 CLINICAL DATA:  Fall, lower extremity paralysis EXAM: PORTABLE CHEST 1 VIEW COMPARISON:  None Available. FINDINGS: 18 mm nodular density is seen within the left mid lung zone, indeterminate, possibly representing a focal pulmonary nodule. Retrocardiac pulmonary infiltrate is present possibly related to changes of acute infection or aspiration. No pneumothorax or pleural effusion. Mild cardiomegaly is present. There is central pulmonary vascular congestion without overt pulmonary edema. No acute bone abnormality. Osseous structures are age-appropriate. IMPRESSION: 1. Retrocardiac pulmonary infiltrate, possibly related to changes of acute infection or aspiration. 2. 18 mm nodular density within the left mid lung zone, indeterminate, possibly representing a focal pulmonary nodule. Dedicated CT imaging is recommended for further characterization. 3. Mild cardiomegaly with central pulmonary vascular congestion. Electronically Signed   By: Helyn Numbers M.D.   On: 11/18/2022 17:25   CT HEAD WO CONTRAST  Result Date: 11/18/2022 CLINICAL DATA:  Head trauma, moderate-severe EXAM: CT HEAD WITHOUT CONTRAST TECHNIQUE: Contiguous axial images were obtained from the base of the skull through the vertex without intravenous contrast. RADIATION DOSE REDUCTION: This exam was performed according to the departmental dose-optimization program which includes automated exposure control, adjustment of the mA and/or kV according to patient size and/or use of iterative reconstruction technique. COMPARISON:  None Available. FINDINGS: Brain: No evidence of acute infarction, hemorrhage, hydrocephalus, extra-axial collection or mass lesion/mass effect. Coarse calcification is noted along the peripheral margin of the falx tentorium on the left. Vascular: No hyperdense vessel or unexpected calcification. Skull: Normal. Negative for fracture or  focal lesion. Sinuses/Orbits: No acute finding. Other: Soft tissue swelling of the scalp near the skull vertex posteriorly. IMPRESSION: 1. No acute intracranial abnormality. 2. Soft tissue swelling of the scalp near the skull vertex posteriorly. No underlying calvarial fracture. Electronically Signed   By: Duanne Guess D.O.   On: 11/18/2022 17:21    Procedures .Critical Care  Performed by: Rexford Maus, DO Authorized by: Rexford Maus, DO   Critical care provider statement:    Critical care time (minutes):  40   Critical care time was exclusive of:  Separately billable procedures and treating other patients   Critical care was necessary to treat or prevent imminent or life-threatening deterioration of the following conditions:  Trauma   Critical care was time spent personally by me on the following activities:  Development of treatment plan with patient or surrogate, discussions with consultants, evaluation of patient's response to  treatment, examination of patient, obtaining history from patient or surrogate, ordering and performing treatments and interventions, ordering and review of laboratory studies, ordering and review of radiographic studies, pulse oximetry, re-evaluation of patient's condition and review of old charts   I assumed direction of critical care for this patient from another provider in my specialty: no     Care discussed with: admitting provider       Medications Ordered in ED Medications  Tdap (BOOSTRIX) injection 0.5 mL ( Intramuscular MAR Unhold 11/18/22 2238)  sodium chloride flush (NS) 0.9 % injection 3 mL (has no administration in time range)  sodium chloride flush (NS) 0.9 % injection 3 mL (has no administration in time range)  0.9 %  sodium chloride infusion (has no administration in time range)  ceFAZolin (ANCEF) IVPB 2g/100 mL premix (has no administration in time range)  acetaminophen (TYLENOL) tablet 650 mg (has no administration in time  range)    Or  acetaminophen (TYLENOL) suppository 650 mg (has no administration in time range)  oxyCODONE (Oxy IR/ROXICODONE) immediate release tablet 5 mg (has no administration in time range)  oxyCODONE (Oxy IR/ROXICODONE) immediate release tablet 10 mg (has no administration in time range)  HYDROmorphone (DILAUDID) injection 1 mg (has no administration in time range)  cyclobenzaprine (FLEXERIL) tablet 10 mg (has no administration in time range)  docusate sodium (COLACE) capsule 100 mg (has no administration in time range)  polyethylene glycol (MIRALAX / GLYCOLAX) packet 17 g (has no administration in time range)  ondansetron (ZOFRAN) tablet 4 mg (has no administration in time range)    Or  ondansetron (ZOFRAN) injection 4 mg (has no administration in time range)  menthol-cetylpyridinium (CEPACOL) lozenge 3 mg (has no administration in time range)    Or  phenol (CHLORASEPTIC) mouth spray 1 spray (has no administration in time range)    ED Course/ Medical Decision Making/ A&P Clinical Course as of 11/18/22 2327  Sat Nov 18, 2022  1806 Received a call from radiology that C-spine was positive for C5/6 anterior listhesis concerning for unstable injury.  MRI was ordered.  Neurosurgery was consulted. [VK]  1823 I spoke with Dr. Maurice Small with neurosurgery who states he will plan to take the patient to the OR, recommends keeping NPO, does not need MRI. [VK]    Clinical Course User Index [VK] Rexford Maus, DO                                 Medical Decision Making This patient presents to the ED with chief complaint(s) of fall with no pertinent past medical history which further complicates the presenting complaint. The complaint involves an extensive differential diagnosis and also carries with it a high risk of complications and morbidity.    The differential diagnosis includes ICH, mass effect, cervical spine fracture, spinal cord injury, abrasions, forearm fracture, no other  traumatic injuries seen on exam, no presyncopal symptoms making syncopal fall unlikely  Additional history obtained: Additional history obtained from N/A Records reviewed N/A  ED Course and Reassessment: On patient's arrival he is hemodynamically stable in no acute distress.  Was in c-collar on arrival.  Does have midline C-spine tenderness and decreased strength in grip strength and elbow flexion bilaterally concerning for possible spinal cord injury.  He does have an abrasion to his left shoulder and left ankle and tetanus will be updated.  Patient declined any pain control at this time and will  be closely reassessed.  Independent labs interpretation:  The following labs were independently interpreted: Mild leukocytosis otherwise within normal range  Independent visualization of imaging: - I independently visualized the following imaging with scope of interpretation limited to determining acute life threatening conditions related to emergency care: CT head/C-spine, chest x-ray, pelvis x-ray, forearm x-ray, which revealed C5/6 anterior listhesis concerning for unstable injury, no other traumatic injury seen on exam  Consultation: - Consulted or discussed management/test interpretation w/ external professional: Neurosurgery  Consideration for admission or further workup: Patient requires admission for OR for cervical stabilization Social Determinants of health: N/A    Amount and/or Complexity of Data Reviewed Labs: ordered. Radiology: ordered.  Risk Prescription drug management. Decision regarding hospitalization.          Final Clinical Impression(s) / ED Diagnoses Final diagnoses:  Fall, initial encounter  Anterolisthesis of cervical spine    Rx / DC Orders ED Discharge Orders     None         Rexford Maus, DO 11/18/22 2327

## 2022-11-18 NOTE — ED Notes (Signed)
Consent signed by this nurse and patient

## 2022-11-18 NOTE — ED Triage Notes (Signed)
Working outside on scaffolding, reached over to grab something not secure and fell off. Fell from 8 feet. Pt with LOC. Has neck pain. Was unable to move feet when ems arrived and is now able to move feet. C-collar placed by ems. A&OX4. Received fentanyl. No obvious deformities.

## 2022-11-18 NOTE — Op Note (Signed)
PATIENT: Roberto Phillips [employer].  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 Upon workup in the ED, a fracture dislocation at C5-6 was diagnosed via CT with listhesis and perched facets. Combined with the temporary paresis, this was considered a clearly unstable fracture pattern and I recommended urgent treatment via ACDF with possible ACCF / posterior reduction and instrumented fusion if needed. This was discussed with the patient as well as risks, benefits, and alternatives and the patient wished to proceed with surgical treatment.   OPERATIVE DETAIL: The patient was taken to the operating room and placed on the OR table in the supine position. A formal time out was performed with two patient identifiers and confirmed the operative site. Anesthesia was induced by the anesthesia team. The patient was positioned with the rigid collar in place to maintain his position and it was then carefully removed while maintaining position. An incision was marked in a skin crease corresponding to C5-6 landmarks. The area was then prepped and draped in a sterile fashion. A transverse linear incision was made on the right side of the neck. The platysma was divided and the sternocleidomastoid muscle was identified. The carotid sheath was palpated, identified, and retracted laterally with the sternocleidomastoid muscle. The strap muscles were identified and retracted medially and the pretracheal fascia was entered. The fracture was immediately obvious with a very large step-off and clear avulsion of the disc  annulus with hematoma in the longus. The longus colli were elevated bilaterally and a self-retaining retractor was placed. Fluoroscopy was used to confirm the level and alignment with the facets still quite perched.  Caspar pins were inserted and distracted to allow exposure to the disc space. The disc was removed and the endplates were prepped to help with reduction. I was able to reduce the facets while taking off the distraction with fairly good reduction of the fracture dislocation. Fluoro confirmed good reduction. The endplates felt strong enough to hold the graft, so thankfully no corpectomy was needed and the reduction appeared good enough that a posterior procedure wasn't needed. An interbody cortical allograft was sized and placed with care to avoid over-expansion. An anterior cervical plate was positioned and secured with screws in the C5 and C6 vertebral bodies and locked. Hemostasis was obtained and confirmed, the wound was copiously irrigated, and the incision was then closed in layers. Counts were correct x2.    EBL:  50mL   DRAINS: none   SPECIMENS: none   [name], MD 11/18/22 7:38 PM

## 2022-11-18 NOTE — Plan of Care (Signed)

## 2022-11-18 NOTE — Transfer of Care (Signed)
Immediate Anesthesia Transfer of Care Note  Patient: Roberto Phillips.  Procedure(s) Performed: ANTERIOR CERVICAL DECOMPRESSION/DISCECTOMY FUSION  CERVICAL FIVE - SIX (Neck)  Patient Location: PACU  Anesthesia Type:General  Level of Consciousness: awake, alert , and oriented  Airway & Oxygen Therapy: Patient Spontanous Breathing  Post-op Assessment: Report given to RN and Post -op Vital signs reviewed and stable  Post vital signs: Reviewed and stable  Last Vitals:  Vitals Value Taken Time  BP 130/67 11/18/22 2149  Temp    Pulse 64 11/18/22 2151  Resp 19 11/18/22 2151  SpO2 96 % 11/18/22 2151  Vitals shown include unfiled device data.  Last Pain:  Vitals:   11/18/22 1600  TempSrc: Oral  PainSc:          Complications: No notable events documented.

## 2022-11-18 NOTE — H&P (Addendum)
Neurosurgery H&P  CC: Fall, cervical fracture with SCI  HPI: This is a 77 y.o. man that presents after a fall off scaffolding with neck pain and temporary plegia which thankfully slowly improved. Some mild dysesthesias but only minimal at this point - improved from before, +severe neck pain. No recent use of anti-platelet or anti-coagulant medications. Does have a history of some prior concerns for a 'bone spur' in his neck but no history of cervical surgery.   ROS: A 14 point ROS was performed and is negative except as noted in the HPI.   PMHx: History reviewed. No pertinent past medical history. FamHx: History reviewed. No pertinent family history. SocHx:  reports that he has never smoked. He does not have any smokeless tobacco history on file. He reports that he does not drink alcohol and does not use drugs.  Exam: Vital signs in last 24 hours: Temp:  [98.8 F (37.1 C)] 98.8 F (37.1 C) (11/02 1600) Pulse Rate:  [62-74] 74 (11/02 1630) BP: (154-165)/(73-83) 165/83 (11/02 1630) SpO2:  [98 %-100 %] 99 % (11/02 1630) Weight:  [74.8 kg] 74.8 kg (11/02 1525) General: Awake, alert, cooperative, lying in bed in NAD Head: Normocephalic HEENT: In well-fitting trauma collar, contusion / abrasion on the left shoulder Pulmonary: breathing room air comfortably, no evidence of increased work of breathing Cardiac: regular, mildly tachy Abdomen: S NT ND Extremities: Warm and well perfused x4 Neuro: Strength 5/5 in BLE, BUE with significant weakness in the left grip and wrist flexors greater than wrist extensors (chronic from prior injury), SILTx4 except some mild numbness in the left hand, no hoffman's, no clonus   Assessment and Plan: 77 y.o. man s/p fall from scaffolding with neck pain and initial severe weakness, improving strength. CT C-spine personally reviewed, which shows C5-6 fracture dislocation with perched and nearly jumped facets and listhesis.   -discussed at length with the patient  and family at bedside as well family on the phone, discussed risks and benefits of surgery for a clearly unstable fracture dislocation with a temporary cord injury. He would like to proceed, his family notes that they have seen bad outcomes from an ACDF due to the fusion component. He wants to proceed, his spouse does not want him to. I did my best to reassure them that, in the setting of an unstable fracture, the risk:benefit ratio is different than for degen pathologies. I am not aware of any equipoise over the best treatment for this pathology in regards to operative versus non-operative treatment.  -will take to OR now for anterior cervical stabilization and possible corpectomy depending on endplate integrity, possible posterior if needed, discussed this in detail with the patient and his family -isolated C-spine injury, will admit to my service post-op, likely will need a stepdown bed  [name], MD 11/18/22 6:25 PM [facility]

## 2022-11-18 NOTE — Progress Notes (Signed)
Fall w/ weakness and neck pain, CT C-spine w/ fracture dislocation at C5-6. Keep in cervical collar, will take to OR shortly for ACDF versus ACCF with possible posterior instrumentation / decompression if needed. Please keep NPO for OR.

## 2022-11-18 NOTE — Anesthesia Procedure Notes (Signed)
Procedure Name: Intubation Date/Time: 11/18/2022 8:34 PM  Performed by: Laruth Bouchard., CRNAPre-anesthesia Checklist: Patient identified Patient Re-evaluated:Patient Re-evaluated prior to induction Oxygen Delivery Method: Circle system utilized Preoxygenation: Pre-oxygenation with 100% oxygen Induction Type: IV induction Ventilation: Mask ventilation without difficulty Laryngoscope Size: Glidescope and 4 Grade View: Grade I Tube type: Oral Tube size: 7.5 mm Number of attempts: 1 Airway Equipment and Method: Rigid stylet and Video-laryngoscopy Placement Confirmation: ETT inserted through vocal cords under direct vision, positive ETCO2 and breath sounds checked- equal and bilateral Secured at: 22 cm Tube secured with: Tape Dental Injury: Teeth and Oropharynx as per pre-operative assessment

## 2022-11-18 NOTE — Anesthesia Preprocedure Evaluation (Signed)
Anesthesia Evaluation  Patient identified by MRN, date of birth, ID band Patient awake    Reviewed: Allergy & Precautions, H&P , NPO status , Patient's Chart, lab work & pertinent test results  Airway Mallampati: II  TM Distance: >3 FB Neck ROM: Full   Comment: C-collar Dental no notable dental hx.    Pulmonary neg pulmonary ROS   Pulmonary exam normal breath sounds clear to auscultation       Cardiovascular negative cardio ROS Normal cardiovascular exam Rhythm:Regular Rate:Normal     Neuro/Psych  fracture / dislocation at C5-6 with weakness  negative psych ROS   GI/Hepatic negative GI ROS, Neg liver ROS,,,  Endo/Other  negative endocrine ROS    Renal/GU negative Renal ROS  negative genitourinary   Musculoskeletal negative musculoskeletal ROS (+)    Abdominal   Peds negative pediatric ROS (+)  Hematology negative hematology ROS (+)   Anesthesia Other Findings   Reproductive/Obstetrics negative OB ROS                              Anesthesia Physical Anesthesia Plan  ASA: 3  Anesthesia Plan: General   Post-op Pain Management:    Induction: Intravenous and Rapid sequence  PONV Risk Score and Plan: Ondansetron and Dexamethasone  Airway Management Planned: Oral ETT  Additional Equipment:   Intra-op Plan:   Post-operative Plan: Extubation in OR  Informed Consent: I have reviewed the patients History and Physical, chart, labs and discussed the procedure including the risks, benefits and alternatives for the proposed anesthesia with the patient or authorized representative who has indicated his/her understanding and acceptance.     Dental advisory given  Plan Discussed with: CRNA  Anesthesia Plan Comments:          Anesthesia Quick Evaluation

## 2022-11-19 MED ORDER — CEFAZOLIN SODIUM 1 G IJ SOLR
INTRAMUSCULAR | Status: AC
  Filled 2022-11-19: qty 10

## 2022-11-19 NOTE — TOC CAGE-AID Note (Signed)
Transition of Care [facility]) - CAGE-AID Screening  Patient Details  Name: Roberto Phillips. MRN: [id number] Date of Birth: 09/12/1945  Clinical Narrative:  Patient denies any current alcohol or drug use, does not need substance abuse resources at this time.  CAGE-AID Screening:   Have You Ever Felt You Ought to Cut Down on Your Drinking or Drug Use?: No Have People Annoyed You By Critizing Your Drinking Or Drug Use?: No Have You Felt Bad Or Guilty About Your Drinking Or Drug Use?: No Have You Ever Had a Drink or Used Drugs First Thing In The Morning to Steady Your Nerves or to Get Rid of a Hangover?: No CAGE-AID Score: 0  Substance Abuse Education Offered: No

## 2022-11-19 NOTE — Progress Notes (Signed)
Neurosurgery Service Progress Note  Subjective: No acute events overnight, arms feel subjectively a little better than preop, no dysphagia, overall doing well   Objective: Vitals:   11/18/22 2220 11/18/22 2244 11/19/22 0447 11/19/22 0727  BP: (!) 145/66 (!) 143/65 114/64 131/71  Pulse: 64 64 67 66  Resp: 19 18 18 17   Temp: 99 F (37.2 C) 98 F (36.7 C) 98.6 F (37 C) 99.2 F (37.3 C)  TempSrc:  Oral Oral Oral  SpO2: 99% 98% 96% 98%  Weight:      Height:        Physical Exam: Strength 5/5 in BLE, BUE diffusely 4/5 in the RUE and 4-/5 in R grip, L grip / WE chronically weak / at baseline and 4 to 4+/5 in the proximal LUE, no hoffman's, SILTx4, incision c/d/i  Assessment & Plan: 77 y.o. man s/p ACDF for fracture dislocation with SCI, recovering well.  -PT/OT today. Will see how he does on his feet. If he's safe to go home and feels ready, can be discharged home later today, otherwise will keep inpatient for some more therapy -SCDs/TEDs, [facility] tomorrow if still inpatient  [name]  11/19/22 7:50 AM

## 2022-11-19 NOTE — Anesthesia Postprocedure Evaluation (Signed)
Anesthesia Post Note  Patient: Roberto Phillips.  Procedure(s) Performed: ANTERIOR CERVICAL DECOMPRESSION/DISCECTOMY FUSION  CERVICAL FIVE - SIX (Neck)     Patient location during evaluation: PACU Anesthesia Type: General Level of consciousness: awake and alert Pain management: pain level controlled Vital Signs Assessment: post-procedure vital signs reviewed and stable Respiratory status: spontaneous breathing, nonlabored ventilation, respiratory function stable and patient connected to nasal cannula oxygen Cardiovascular status: blood pressure returned to baseline and stable Postop Assessment: no apparent nausea or vomiting Anesthetic complications: no   No notable events documented.  Last Vitals:  Vitals:   11/18/22 2220 11/18/22 2244  BP: (!) 145/66 (!) 143/65  Pulse: 64 64  Resp: 19 18  Temp: 37.2 C 36.7 C  SpO2: 99% 98%    Last Pain:  Vitals:   11/18/22 2244  TempSrc: Oral  PainSc: 0-No pain                 [place]

## 2022-11-19 NOTE — Evaluation (Addendum)
Occupational Therapy Evaluation Patient Details Name: Hayato Guaman [facility]. MRN: [id number] DOB: 02/21/1945 Today's Date: 11/19/2022   History of Present Illness 77 y.o. man that presents 10/2 after a fall off scaffolding with neck pain and temporary plegia, that presents as mild dysesthesias with severe neck pain.  CT C-spine shows C5-6 fracture dislocation with perched and nearly jumped facets and listhesis. S/p 11/2 C5-C6 anterior cervical discectomy and instrumented fusion. PMH: No pertinent past medical history.   Clinical Impression   Pt ind at baseline with ADLs/functional mobility, lives with spouse who can assist at d/c. Pt currently needing set up -min A for ADLs, CGA for bed mobility using log roll technique, and CGA for transfers without AD, pt pulling up on back of chair to stand. Pt with BUE L >R deficits in strength, ROM and coordination, though does report some baseline LUE deficits from prior incident, provided with squeeze ball for strengthening. Pt presenting with impairments listed below, will follow acutely. Recommend OP OT at d/c.        If plan is discharge home, recommend the following: A little help with walking and/or transfers;A little help with bathing/dressing/bathroom;Assistance with cooking/housework;Direct supervision/assist for medications management;Direct supervision/assist for financial management;Assist for transportation;Help with stairs or ramp for entrance    Functional Status Assessment  Patient has had a recent decline in their functional status and demonstrates the ability to make significant improvements in function in a reasonable and predictable amount of time.  Equipment Recommendations  Tub/shower seat    Recommendations for Other Services PT consult     Precautions / Restrictions Precautions Precautions: Cervical Precaution Booklet Issued: Yes (comment) Precaution Comments: handout in room, educated pt on cervical  prec Restrictions Weight Bearing Restrictions: No      Mobility Bed Mobility Overal bed mobility: Needs Assistance Bed Mobility: Rolling, Sidelying to Sit, Sit to Sidelying Rolling: Contact guard assist Sidelying to sit: Contact guard assist     Sit to sidelying: Contact guard assist General bed mobility comments: educated on spinal precautions and log rolling for maintaining spinal alignment, contact guard at hips and shoulder to facilitate moving together    Transfers Overall transfer level: Needs assistance Equipment used: None Transfers: Sit to/from Stand Sit to Stand: Contact guard assist                  Balance Overall balance assessment: Mild deficits observed, not formally tested                                         ADL either performed or assessed with clinical judgement   ADL Overall ADL's : Needs assistance/impaired Eating/Feeding: Set up   Grooming: Minimal assistance   Upper Body Bathing: Minimal assistance   Lower Body Bathing: Minimal assistance   Upper Body Dressing : Minimal assistance   Lower Body Dressing: Minimal assistance   Toilet Transfer: Minimal assistance   Toileting- Clothing Manipulation and Hygiene: Minimal assistance       Functional mobility during ADLs: Minimal assistance       Vision   Vision Assessment?: No apparent visual deficits     Perception Perception: Not tested       Praxis Praxis: Not tested       Pertinent Vitals/Pain Pain Assessment Pain Assessment: No/denies pain     Extremity/Trunk Assessment Upper Extremity Assessment Upper Extremity Assessment: Generalized weakness;RUE deficits/detail;LUE deficits/detail RUE Deficits /  Details: globally 3/5 decr coordination and grasp LUE Deficits / Details: decr grasp/deficits from prior accident, overall 3/5 global weakness and decr coordination   Lower Extremity Assessment Lower Extremity Assessment: Defer to PT evaluation    Cervical / Trunk Assessment Cervical / Trunk Assessment: Neck Surgery   Communication Communication Communication: No apparent difficulties   Cognition Arousal: Alert Behavior During Therapy: WFL for tasks assessed/performed, Flat affect Overall Cognitive Status: Within Functional Limits for tasks assessed                                 General Comments: overall flat affect, cognition appears baseline     General Comments  VSS    Exercises     Shoulder Instructions      Home Living Family/patient expects to be discharged to:: Private residence Living Arrangements: Spouse/significant other Available Help at Discharge: Family;Available 24 hours/day Type of Home: House Home Access: Stairs to enter [employer] of Steps: 3 Entrance Stairs-Rails: Can reach both Home Layout: One level     Bathroom Shower/Tub: Chief Strategy Officer: Standard Bathroom Accessibility: No   Home Equipment: None          Prior Functioning/Environment Prior Level of Function : Independent/Modified Independent             Mobility Comments: driving, ind with ADLs/IADLs ADLs Comments: ind        OT Problem List: Decreased strength;Decreased range of motion;Decreased activity tolerance;Impaired balance (sitting and/or standing);Impaired sensation;Impaired UE functional use      OT Treatment/Interventions: Self-care/ADL training;Therapeutic exercise;Energy conservation;DME and/or AE instruction;Therapeutic activities;Patient/family education;Balance training;Cognitive remediation/compensation    OT Goals(Current goals can be found in the care plan section) Acute Rehab OT Goals Patient Stated Goal: none stated OT Goal Formulation: With patient Time For Goal Achievement: 12/03/22 Potential to Achieve Goals: Good  OT Frequency: Min 1X/week    Co-evaluation              AM-PAC OT "6 Clicks" Daily Activity     Outcome Measure Help from  another person eating meals?: A Little Help from another person taking care of personal grooming?: A Little Help from another person toileting, which includes using toliet, bedpan, or urinal?: A Little Help from another person bathing (including washing, rinsing, drying)?: A Little Help from another person to put on and taking off regular upper body clothing?: A Little Help from another person to put on and taking off regular lower body clothing?: A Little 6 Click Score: 18   End of Session Equipment Utilized During Treatment: Gait belt Nurse Communication: Mobility status  Activity Tolerance: Patient tolerated treatment well Patient left: in bed;with call bell/phone within reach;with bed alarm set  OT Visit Diagnosis: Unsteadiness on feet (R26.81);Other abnormalities of gait and mobility (R26.89);Muscle weakness (generalized) (M62.81);Other symptoms and signs involving the nervous system (R29.898)                Time: 0981-1914 OT Time Calculation (min): 25 min Charges:  OT General Charges $OT Visit: 1 Visit OT Evaluation $OT Eval Moderate Complexity: 1 Mod OT Treatments $Self Care/Home Management : 8-22 mins  Carver Fila, OTD, OTR/L SecureChat Preferred Acute Rehab [phone]   Carver Fila Koonce 11/19/2022, 1:27 PM

## 2022-11-19 NOTE — Discharge Summary (Signed)
Discharge Summary  Date of Admission: 11/18/2022  Date of Discharge: 11/20/22  Attending Physician: Autumn Patty, MD  Hospital Course: Patient was admitted after a fall off scaffolding with spinal cord injury due to a cervical fracture dislocation. He was taken to the OR shortly after arrival for an anterior reduction and ACDF to stabilize the fracture. He did well post-op with continued improvement in his strength. His hospital course was uncomplicated and the patient was discharged home. They will follow up in clinic with me in clinic in 2 weeks.  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

## 2022-11-19 NOTE — Plan of Care (Signed)

## 2022-11-19 NOTE — Evaluation (Signed)
Physical Therapy Evaluation Patient Details Name: Roberto Phillips. MRN: [id number] DOB: [birth year] Today's Date: 11/19/2022  History of Present Illness  77 y.o. man that presents 10/2 after a fall off scaffolding with neck pain and temporary plegia, that presents as mild dysesthesias with severe neck pain.  CT C-spine shows C5-6 fracture dislocation with perched and nearly jumped facets and listhesis. S/p 11/2 C5-C6 anterior cervical discectomy and instrumented fusion. PMH: No pertinent past medical history.  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 Pt is currently limited in safe mobility by post op neck pain and generalized soreness from fall. Pt educated in spinal precautions prior to mobilization. Pt currently contact guard for bed mobility, mod I for transfers and contact guard for ambulation in hallway due to mild instability. Pt educated to mobilize hourly to decrease stiffness. Pt has no PT or equipment needs at discharge. PT will continue to follow acutely and refer to Mobility Specialist.       If plan is discharge home, recommend the following: A little help with walking and/or transfers;A little help with bathing/dressing/bathroom;Assistance with cooking/housework;Assist for transportation;Help with stairs or ramp for entrance   Can travel by private vehicle   Yes     Equipment Recommendations None recommended by PT  Recommendations for Other Services  OT consult    Functional Status Assessment Patient has had a recent decline in their functional status and demonstrates the ability to make significant improvements in function in a reasonable and predictable amount of time.     Precautions / Restrictions Precautions Precautions: Cervical Precaution Booklet Issued: Yes (comment) Restrictions Weight Bearing Restrictions: No      Mobility  Bed Mobility Overal bed mobility: Needs Assistance Bed Mobility: Rolling, Sidelying to  Sit Rolling: Contact guard assist Sidelying to sit: Min assist       General bed mobility comments: educated on spinal precautions and log rolling for maintaining spinal alignment, contact guard at hips and shoulder to facilitate moving together    Transfers Overall transfer level: Modified independent Equipment used: None               General transfer comment: increased time and effort but pt able to power up and self steady    Ambulation/Gait Ambulation/Gait assistance: Contact guard assist Gait Distance (Feet): 100 Feet Assistive device: None Gait Pattern/deviations: Step-through pattern, Decreased step length - right, Decreased step length - left, Shuffle Gait velocity: slowed Gait velocity interpretation: <1.8 ft/sec, indicate of risk for recurrent falls   General Gait Details: contact guard for slowed shuffling gait, mildly unsteady but no overt LoB, distance limited by pain and need to urinate. Pt unable to hold urine until urinal could be provided      Balance Overall balance assessment: Mild deficits observed, not formally tested                                           Pertinent Vitals/Pain Pain Assessment Pain Assessment: 0-10 Pain Score: 0-No pain    Home Living Family/patient expects to be discharged to:: Private residence Living Arrangements: Spouse/significant other Available Help at Discharge: Family;Available 24 hours/day Type of Home: House Home Access: Stairs to enter Entrance Stairs-Rails: Can reach both Entrance Stairs-Number of Steps: 3   Home Layout: One level Home Equipment: None      Prior Function Prior Level of Function : Independent/Modified  Independent             Mobility Comments: driving, owns his own business       Extremity/Trunk Assessment   Upper Extremity Assessment Upper Extremity Assessment: Defer to OT evaluation    Lower Extremity Assessment Lower Extremity Assessment: RLE  deficits/detail;LLE deficits/detail RLE Deficits / Details: ROM WFL, strength grossly 4/5 RLE Sensation: WNL RLE Coordination: WNL LLE Deficits / Details: ROM WFL, strength grossly 4/5 LLE Sensation: WNL LLE Coordination: WNL    Cervical / Trunk Assessment Cervical / Trunk Assessment: Neck Surgery  Communication   Communication Communication: No apparent difficulties  Cognition Arousal: Alert Behavior During Therapy: WFL for tasks assessed/performed Overall Cognitive Status: Within Functional Limits for tasks assessed                                          General Comments General comments (skin integrity, edema, etc.): incision site with mild erythema and edema, well approximated, significant other in room        Assessment/Plan    PT Assessment Patient needs continued PT services  PT Problem List Decreased strength;Decreased activity tolerance;Decreased balance;Decreased mobility;Pain       PT Treatment Interventions DME instruction;Gait training;Functional mobility training;Stair training;Therapeutic activities;Therapeutic exercise;Balance training;Cognitive remediation;Patient/family education    PT Goals (Current goals can be found in the Care Plan section)  Acute Rehab PT Goals PT Goal Formulation: With patient/family Time For Goal Achievement: 12/03/22 Potential to Achieve Goals: Good    Frequency Min 1X/week        AM-PAC PT "6 Clicks" Mobility  Outcome Measure Help needed turning from your back to your side while in a flat bed without using bedrails?: A Little Help needed moving from lying on your back to sitting on the side of a flat bed without using bedrails?: A Little Help needed moving to and from a bed to a chair (including a wheelchair)?: None Help needed standing up from a chair using your arms (e.g., wheelchair or bedside chair)?: None Help needed to walk in hospital room?: None Help needed climbing 3-5 steps with a railing? :  A Little 6 Click Score: 21    End of Session Equipment Utilized During Treatment: Gait belt Activity Tolerance: Patient limited by pain Patient left: in chair;with call bell/phone within reach Nurse Communication: Mobility status PT Visit Diagnosis: Unsteadiness on feet (R26.81);Muscle weakness (generalized) (M62.81);Pain Pain - part of body:  (neck)    Time: 6295-2841 PT Time Calculation (min) (ACUTE ONLY): 46 min   Charges:   PT Evaluation $PT Eval Moderate Complexity: 1 Mod PT Treatments $Gait Training: 8-22 mins $Therapeutic Activity: 8-22 mins PT General Charges $$ ACUTE PT VISIT: 1 Visit         Bradd Merlos B. Beverely Risen PT, DPT Acute Rehabilitation Services Please use secure chat or  Call Office [phone]   Elon Alas [facility] 11/19/2022, 12:41 PM

## 2022-11-20 LAB — URINALYSIS, ROUTINE W REFLEX MICROSCOPIC
Bilirubin Urine: NEGATIVE
Glucose, UA: NEGATIVE mg/dL
Hgb urine dipstick: NEGATIVE
Ketones, ur: 5 mg/dL — AB
Leukocytes,Ua: NEGATIVE
Nitrite: NEGATIVE
Protein, ur: NEGATIVE mg/dL
Specific Gravity, Urine: 1.005 (ref 1.005–1.030)
pH: 7 (ref 5.0–8.0)

## 2022-11-20 NOTE — Progress Notes (Addendum)
Neurosurgery Service Progress Note  Subjective: No acute events overnight, arms continue to improve, did well with PT/OT, wasn't able to brush teeth or use utensils well until today but that is coming along nicely, not taking any pain medication, no dysphagia, overall doing well   Objective: Vitals:   11/19/22 1532 11/19/22 2046 11/20/22 0017 11/20/22 0500  BP: 131/76 130/60 133/63 (!) 130/57  Pulse: 66 69 66 74  Resp: 18 18 18 18   Temp: 98.3 F (36.8 C) 99.3 F (37.4 C) 99.3 F (37.4 C) 98.6 F (37 C)  TempSrc: Oral Oral Oral Oral  SpO2: 99% 96% 96% 96%  Weight:      Height:        Physical Exam: Strength 5/5 in BLE, BUE diffusely 4/5 in the RUE and 4-/5 in R grip, L grip / WE chronically weak / at baseline and 4 to 4+/5 in the proximal LUE, no hoffman's, SILTx4, incision c/d/i  Assessment & Plan: 77 y.o. man s/p ACDF for fracture dislocation with SCI, recovering well.  -discharge home today -d/c foley -can wear soft collar for comfort    Roberto Phillips  11/20/22 8:19 AM

## 2022-11-20 NOTE — TOC Transition Note (Signed)
Transition of Care [facility]) - CM/SW Discharge Note   Patient Details  Name: Jedi Cat[facility]. MRN: [id number] Date of Birth: [birth year]  [facility]) CM/SW Contact:  Kermit Balo, RN Phone Number: 11/20/2022, 11:08 AM   Clinical Narrative:     Patient is discharging home post surgery. OT recommending outpt therapy but pt will follow up at surgery office before this is arranged.  Walker and BSC ordered through [employer] and will be delivered to the patients room.  Pt has transportation home.   Final next level of care: Home/Self Care Barriers to Discharge: No Barriers Identified   Patient Goals and CMS Choice   Choice offered to / list presented to : Patient  Discharge Placement                         Discharge Plan and Services Additional resources added to the After Visit Summary for                  DME Arranged: Bedside commode, Walker rolling DME Agency: [employer] Date DME Agency Contacted: 11/20/22   Representative spoke with at DME Agency: Zack            Social Determinants of Health (SDOH) Interventions SDOH Screenings   Tobacco Use: Unknown (11/18/2022)     Readmission Risk Interventions     No data to display

## 2022-11-20 NOTE — Progress Notes (Signed)
Reviewed DC summary and instructions with the pt and his Spouse. Roberto Phillips voiced understanding of his instructions. DME equipment has arrived to the room and pt will be wheeled off unit by this RN.

## 2022-11-20 NOTE — Progress Notes (Addendum)
    Durable Medical Equipment  (From admission, onward)           Start     Ordered   11/20/22 0942  For home use only DME Walker rolling  Once       Question Answer Comment  Walker: With 5 Inch Wheels   Patient needs a walker to treat with the following condition Weakness      11/20/22 0942   11/20/22 0942  For home use only DME Bedside commode  Once       Question:  Patient needs a bedside commode to treat with the following condition  Answer:  Weakness   11/20/22 0942            The patient is confined to one level of the home environment and there is no toilet on the that level of the home.

## 2022-11-20 NOTE — Progress Notes (Signed)
Occupational Therapy Treatment Patient Details Name: Roberto Phillips [facility]. MRN: [id number] DOB: [birth year] Today's Date: 11/20/2022   History of present illness 77 y.o. man that presents 10/2 after a fall off scaffolding with neck pain and temporary plegia, that presents as mild dysesthesias with severe neck pain.  CT C-spine shows C5-6 fracture dislocation with perched and nearly jumped facets and listhesis. S/p 11/2 C5-C6 anterior cervical discectomy and instrumented fusion. PMH: No pertinent past medical history.   OT comments  Pt progressing towards goals this session, good recall of cervical precautions and overal needing CGA-mod A for ADLs, CGA for bed mobility and CGA for transfers with RW. Also discussed use of 3in1 as shower seat for home. Pt provided with fine motor coordination exercise handout along with theraputty and built up utensil, discussed use for home and pt verbalized understanding. Pt presenting with impairments listed below, will follow acutely. Continue to recommend OP OT at d/c.       If plan is discharge home, recommend the following:  A little help with walking and/or transfers;A little help with bathing/dressing/bathroom;Assistance with cooking/housework;Direct supervision/assist for medications management;Direct supervision/assist for financial management;Assist for transportation;Help with stairs or ramp for entrance   Equipment Recommendations  BSC/3in1;Other (comment) (RW)    Recommendations for Other Services PT consult    Precautions / Restrictions Precautions Precautions: Cervical Precaution Booklet Issued: Yes (comment) Precaution Comments: handout in room, educated pt on cervical prec Restrictions Weight Bearing Restrictions: No       Mobility Bed Mobility Overal bed mobility: Needs Assistance Bed Mobility: Rolling, Sidelying to Sit, Sit to Sidelying Rolling: Contact guard assist Sidelying to sit: Contact guard assist     Sit to sidelying:  Contact guard assist General bed mobility comments: use of log roll technique    Transfers Overall transfer level: Needs assistance Equipment used: None Transfers: Sit to/from Stand Sit to Stand: Contact guard assist                 Balance Overall balance assessment: Mild deficits observed, not formally tested                                         ADL either performed or assessed with clinical judgement   ADL Overall ADL's : Needs assistance/impaired                     Lower Body Dressing: Moderate assistance;Sitting/lateral leans Lower Body Dressing Details (indicate cue type and reason): assist due to decr grasp Toilet Transfer: Contact guard assist;Ambulation;Rolling walker (2 wheels);Regular Toilet       Tub/ Shower Transfer: Contact guard assist;Tub transfer   Functional mobility during ADLs: Rolling walker (2 wheels);Contact guard assist      Extremity/Trunk Assessment Upper Extremity Assessment Upper Extremity Assessment: Generalized weakness RUE Deficits / Details: globally 3/5 decr coordination and grasp LUE Deficits / Details: decr grasp/deficits from prior accident, overall 3/5 global weakness and decr coordination   Lower Extremity Assessment Lower Extremity Assessment: Defer to PT evaluation        Vision   Vision Assessment?: No apparent visual deficits   Perception Perception Perception: Not tested   Praxis Praxis Praxis: Not tested    Cognition Arousal: Alert Behavior During Therapy: WFL for tasks assessed/performed, Flat affect Overall Cognitive Status: Within Functional Limits for tasks assessed  General Comments: overall flat affect, cognition appears baseline        Exercises Exercises: Other exercises Other Exercises Other Exercises: pronated isolated digit extension x5 Other Exercises: digit opposition x5 Other Exercises: theraputty digit extension  x5 Other Exercises: [facility] handout provided    Shoulder Instructions       General Comments VSS    Pertinent Vitals/ Pain       Pain Assessment Pain Assessment: No/denies pain  Home Living                                          Prior Functioning/Environment              Frequency  Min 1X/week        Progress Toward Goals  OT Goals(current goals can now be found in the care plan section)  Progress towards OT goals: Progressing toward goals  Acute Rehab OT Goals Patient Stated Goal: none stated OT Goal Formulation: With patient Time For Goal Achievement: 12/03/22 Potential to Achieve Goals: Good ADL Goals Pt Will Perform Lower Body Dressing: with supervision;sitting/lateral leans;sit to/from stand Pt Will Transfer to Toilet: with supervision;ambulating;regular height toilet Pt Will Perform Tub/Shower Transfer: Tub transfer;Shower transfer;with supervision;ambulating Pt/caregiver will Perform Home Exercise Program: Both right and left upper extremity;With written HEP provided;Increased ROM;Increased strength;With Supervision  Plan      Co-evaluation                 AM-PAC OT "6 Clicks" Daily Activity     Outcome Measure   Help from another person eating meals?: A Little Help from another person taking care of personal grooming?: A Little Help from another person toileting, which includes using toliet, bedpan, or urinal?: A Little Help from another person bathing (including washing, rinsing, drying)?: A Lot Help from another person to put on and taking off regular upper body clothing?: A Little Help from another person to put on and taking off regular lower body clothing?: A Lot 6 Click Score: 16    End of Session Equipment Utilized During Treatment: Rolling walker (2 wheels)  OT Visit Diagnosis: Unsteadiness on feet (R26.81);Other abnormalities of gait and mobility (R26.89);Muscle weakness (generalized) (M62.81);Other symptoms and  signs involving the nervous system (R29.898)   Activity Tolerance Patient tolerated treatment well   Patient Left in bed;with call bell/phone within reach;with family/visitor present   Nurse Communication Mobility status        Time: 6578-4696 OT Time Calculation (min): 24 min  Charges: OT General Charges $OT Visit: 1 Visit OT Treatments $Self Care/Home Management : 8-22 mins $Therapeutic Exercise: 8-22 mins  Carver Fila, OTD, OTR/L SecureChat Preferred Acute Rehab [phone]   Carver Fila Koonce 11/20/2022, 9:16 AM

## 2022-11-21 ENCOUNTER — Encounter (HOSPITAL_COMMUNITY): Payer: Self-pay | Admitting: Neurological Surgery
